# Patient Record
Sex: Male | Born: 2005 | Race: White | Hispanic: No | Marital: Single | Smoking: Never smoker
Health system: Southern US, Community
[De-identification: ages and names within clinical notes are randomized; demographics above are authoritative.]

## PROBLEM LIST (undated history)

## (undated) DIAGNOSIS — F32A Depression, unspecified: Secondary | ICD-10-CM

## (undated) DIAGNOSIS — J45909 Unspecified asthma, uncomplicated: Secondary | ICD-10-CM

## (undated) HISTORY — DX: Unspecified asthma, uncomplicated: J45.909

## (undated) HISTORY — DX: Depression, unspecified: F32.A

## (undated) HISTORY — PX: CIRCUMCISION: SUR203

---
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
## 2024-02-03 ENCOUNTER — Telehealth: Payer: Self-pay | Admitting: Family Medicine

## 2024-02-03 ENCOUNTER — Other Ambulatory Visit: Payer: Self-pay | Admitting: Family Medicine

## 2024-02-03 NOTE — Telephone Encounter (Signed)
 Immunization Record to be filled out--placed in dr's folder.  Please call pt for pick-up at [phone]  *Pt needs form back by August 1st*

## 2024-02-04 NOTE — Telephone Encounter (Signed)
 Pt Immunization form received placed on Dr Johnny red folder.

## 2024-02-07 NOTE — Telephone Encounter (Signed)
The form is ready

## 2024-02-07 NOTE — Telephone Encounter (Signed)
 Pt is aware to pick up immunization form from the office, form was placed in the front office cabinet for pt to pick up.Copy made and sent to scanning

## 2025-02-09 ENCOUNTER — Ambulatory Visit: Admitting: Family Medicine
